# Patient Record
Sex: Female | Born: 1995 | Race: White | Hispanic: No | Marital: Single | Smoking: Never smoker
Health system: Southern US, Community
[De-identification: ages and names within clinical notes are randomized; demographics above are authoritative.]

## PROBLEM LIST (undated history)

## (undated) DIAGNOSIS — F419 Anxiety disorder, unspecified: Secondary | ICD-10-CM

## (undated) DIAGNOSIS — H832X9 Labyrinthine dysfunction, unspecified ear: Secondary | ICD-10-CM

## (undated) DIAGNOSIS — N189 Chronic kidney disease, unspecified: Secondary | ICD-10-CM

## (undated) HISTORY — DX: Anxiety disorder, unspecified: F41.9

## (undated) HISTORY — DX: Chronic kidney disease, unspecified: N18.9

---
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
## 2017-12-19 ENCOUNTER — Encounter (HOSPITAL_COMMUNITY): Payer: Self-pay | Admitting: Emergency Medicine

## 2017-12-19 ENCOUNTER — Ambulatory Visit (HOSPITAL_COMMUNITY)
Admission: EM | Admit: 2017-12-19 | Discharge: 2017-12-19 | Disposition: A | Discharge status: Home / Self Care | Payer: BLUE CROSS/BLUE SHIELD | Attending: Family Medicine | Admitting: Family Medicine

## 2017-12-19 DIAGNOSIS — R531 Weakness: Secondary | ICD-10-CM | POA: Diagnosis present

## 2017-12-19 DIAGNOSIS — R5383 Other fatigue: Secondary | ICD-10-CM

## 2017-12-19 DIAGNOSIS — R7989 Other specified abnormal findings of blood chemistry: Secondary | ICD-10-CM | POA: Diagnosis not present

## 2017-12-19 DIAGNOSIS — K5900 Constipation, unspecified: Secondary | ICD-10-CM | POA: Diagnosis present

## 2017-12-19 LAB — POCT I-STAT, CHEM 8
BUN: 24 mg/dL — AB (ref 6–20)
CALCIUM ION: 1.13 mmol/L — AB (ref 1.15–1.40)
CREATININE: 1.2 mg/dL — AB (ref 0.44–1.00)
Chloride: 102 mmol/L (ref 98–111)
Glucose, Bld: 86 mg/dL (ref 70–99)
HCT: 41 % (ref 36.0–46.0)
Hemoglobin: 13.9 g/dL (ref 12.0–15.0)
POTASSIUM: 3.8 mmol/L (ref 3.5–5.1)
Sodium: 140 mmol/L (ref 135–145)
TCO2: 25 mmol/L (ref 22–32)

## 2017-12-19 LAB — TSH: TSH: 1.191 u[IU]/mL (ref 0.350–4.500)

## 2017-12-19 NOTE — Discharge Instructions (Addendum)
As discussed, your creatinine is elevated today.  Back in January, it was 0.86, today with i-STAT, it was 1.20.  Your BUN was elevated at 24.  this could be an acute change caused by dehydration, but could also be other changes in Jocelyn Williams body that requires further work-up.  Although this lab is concerning, does not require work-up by the emergency department right now.  Please follow-up with your primary care within the week for further evaluation and recheck of creatinine.  Please try to stay hydrated, your urine should be clear to pale yellow in color.  If experiencing worsening symptoms, abdominal pain, nausea, vomiting, weakness, dizziness, passing out, go to the emergency department for further evaluation.

## 2017-12-19 NOTE — ED Provider Notes (Signed)
[facility]    CSN: [id number] Arrival date & time: 12/19/17  1929     History   Chief Complaint Chief Complaint  Patient presents with  . Constipation  . Fatigue    HPI Jocelyn Williams S Kincade is a 22 y.o. female.   22 year old female comes in with mother for constipation, fatigue, leg weakness.  States constipation is normal for her, but did not know if it was related to her other symptoms.  Her main complaint has been 4911-month history of leg weakness, states has been more trained/fatigue to the past few days, and came in for evaluation.  She denies any pain to the lower extremities, states mostly feels as if her legs are about to give out.  She states "I have been playing soccer for many years, and it feels like it played the whole soccer game, although I have not."  No obvious injury/trauma.  No numbness, tingling.  Symptoms are intermittent without obvious aggravating or alleviating factor.  She denies fever, chills, night sweats.  Denies URI symptoms such as cough, congestion, sore throat.  Denies abdominal pain, nausea, vomiting.  Denies urinary symptoms such as frequency, dysuria, hematuria.  States that she has recently started eating more healthy, but denies new supplement use other than fish oil.  She has also started working out at [employer], but she states no extreme or strenuous activity.  Denies heat exposure.  She has continued to eat and drink without difficulty.  Has had regular cycles, though regularly " moderately heavy".  Denies weakness, dizziness, syncope.  Of note, she has also been evaluated by multiple specialist for atypical chest pain.  She has seen cardiology, pulmonology, gastroenterology.  She states has continued to be evaluated for neuralgia, though at the time, did not have similar symptoms to the leg.          History reviewed. No pertinent past medical history.  Patient Active Problem List   Diagnosis Date Noted  . Chest pain 06/15/2015     History reviewed. No pertinent surgical history.  OB History   Jocelyn Williams      Home Medications    Prior to Admission medications   Medication Sig Start Date End Date Taking? Authorizing Provider  Omega-3 Fatty Acids (FISH OIL) 1000 MG CAPS Take by mouth.   Yes [provider]    Family History No family history on file.  Social History Social History   Tobacco Use  . Smoking status: Not on file  Substance Use Topics  . Alcohol use: Not on file  . Drug use: Not on file     Allergies   Patient has no known allergies.   Review of Systems Review of Systems  Reason unable to perform ROS: See HPI as above.     Physical Exam Triage Vital Signs ED Triage Vitals  Enc Vitals Group     BP 12/19/17 1944 98/62     Pulse Rate 12/19/17 1944 67     Resp 12/19/17 1944 16     Temp 12/19/17 1944 98.1 F (36.7 C)     Temp Source 12/19/17 1944 Oral     SpO2 12/19/17 1944 99 %     Weight 12/19/17 1945 135 lb (61.2 kg)     Height --      Head Circumference --      Peak Flow --      Pain Score 12/19/17 1945 0     Pain Loc --  Pain Edu? --      Excl. in GC? --    Orthostatic VS for the past 24 hrs:  BP- Lying Pulse- Lying BP- Sitting Pulse- Sitting BP- Standing at 0 minutes Pulse- Standing at 0 minutes  12/19/17 2044 101/62 56 93/58 65 95/59 78    Updated Vital Signs BP 98/62 (BP Location: Left Arm)   Pulse 67   Temp 98.1 F (36.7 C) (Oral)   Resp 16   Wt 135 lb (61.2 kg)   LMP 11/21/2017   SpO2 99%   Physical Exam  Constitutional: She is oriented to person, place, and time. She appears well-developed and well-nourished. No distress.  HENT:  Head: Normocephalic and atraumatic.  Right Ear: Tympanic membrane, external ear and ear canal normal. Tympanic membrane is not erythematous and not bulging.  Left Ear: Tympanic membrane, external ear and ear canal normal. Tympanic membrane is not erythematous and not bulging.  Nose: Nose normal. Right sinus  exhibits no maxillary sinus tenderness and no frontal sinus tenderness. Left sinus exhibits no maxillary sinus tenderness and no frontal sinus tenderness.  Mouth/Throat: Uvula is midline, oropharynx is clear and moist and mucous membranes are normal.  Eyes: Pupils are equal, round, and reactive to light. Conjunctivae are normal.  Neck: Normal range of motion. Neck supple.  Cardiovascular: Normal rate, regular rhythm and normal heart sounds. Exam reveals no gallop and no friction rub.  No murmur heard. Pulmonary/Chest: Effort normal and breath sounds normal. She has no decreased breath sounds. She has no wheezes. She has no rhonchi. She has no rales.  Abdominal: Soft. Bowel sounds are normal. She exhibits no mass. There is no tenderness. There is no rebound, no guarding and no CVA tenderness.  Musculoskeletal:  No swelling, erythema, increased warmth, contusion seen.  No tenderness to palpation.  Full range of motion of back, hip, knee, ankles.  Strength normal and equal bilaterally.  Sensation intact and equal bilaterally.  Pedal pulse 2+ and equal bilaterally.   Lymphadenopathy:    She has no cervical adenopathy.  Neurological: She is alert and oriented to person, place, and time.  Skin: Skin is warm and dry.  Psychiatric: She has a normal mood and affect. Her behavior is normal. Judgment normal.     UC Treatments / Results  Labs (all labs ordered are listed, but only abnormal results are displayed) Labs Reviewed  POCT I-STAT, CHEM 8 - Abnormal; Notable for the following components:      Result Value   BUN 24 (*)    Creatinine, Ser 1.20 (*)    Calcium, Ion 1.13 (*)    All other components within normal limits  TSH    EKG Jocelyn Williams  Radiology No results found.  Procedures Procedures (including critical care time)  Medications Ordered in UC Medications - No data to display  Initial Impression / Assessment and Plan / UC Course  I have reviewed the triage vital signs and the  nursing notes.  Pertinent labs & imaging results that were available during my care of the patient were reviewed by me and considered in my medical decision making (see chart for details).    Discussed case with Dr. Tracie Harrier as well as Dr. Dayton Scrape.  I-STAT with elevated creatinine of 1.2, with baseline at 0.866 months ago.  Slightly elevated BUN at 24.  Negative orthostatics.  TSH pending.  Discussed although concerning history and exam, no immediate need for further evaluation in the emergency department.  Will have patient push fluids, and  monitor closely.  Patient to follow-up with PCP this week for recheck, further evaluation needed.  Return precautions given.  Patient and mother expresses understanding and agrees to plan.  Final Clinical Impressions(s) / UC Diagnoses   Final diagnoses:  Elevated serum creatinine  Fatigue, unspecified type    ED Prescriptions    Jocelyn Williams        Belinda Fisher, PA-C 12/19/17 2226

## 2017-12-19 NOTE — ED Triage Notes (Signed)
PT reports constipation, fatigue, and leg weakness for weeks.   Last BM was 2 days ago.   Leg weakness is bilateral.

## 2017-12-27 ENCOUNTER — Encounter: Payer: Self-pay | Admitting: Dietician

## 2017-12-27 ENCOUNTER — Encounter: Payer: BLUE CROSS/BLUE SHIELD | Attending: Pediatrics | Admitting: Dietician

## 2017-12-27 VITALS — Ht 66.0 in | Wt 120.3 lb

## 2017-12-27 DIAGNOSIS — R634 Abnormal weight loss: Secondary | ICD-10-CM | POA: Diagnosis not present

## 2017-12-27 DIAGNOSIS — R5383 Other fatigue: Secondary | ICD-10-CM

## 2017-12-27 NOTE — Patient Instructions (Signed)
   Start eating a light snack at suppertime such as rice cake and peanut butter, cheese or peanut butter and crackers, oatmeal with nuts and fruit, sandwich, etc. Gradually increase the amount of food eaten.   Continue with Ensure in evenings until able to eat a full supper meal or weight starts to increase steadily.   Add 1 or 2 servings of carb foods at lunch time.   Can add nuts, peanut butter, or cheese with fruit for snacks.   Set reminder alarms to eat regularly. Allow for small meals and several snacks daily to avoid feeling too full.   Try a mild fiber supplement such as guar gum to help with bowel movements. Discuss with physician.

## 2017-12-27 NOTE — Progress Notes (Signed)
Medical Nutrition Therapy: Visit start time: 1100  end time: 1200  Assessment:  Diagnosis: abnormal weight loss, fatigue Past medical history: history of chest pain Psychosocial issues/ stress concerns: none currently  Preferred learning method:  Jill Alexanders. Visual . Hands-on   Current weight: 120.3lbs Height: 5'6" Medications, supplements: fish oil and multivitamin supplements, no other meds  Progress and evaluation: patient reports weight of 160lbs 2-3 years ago when playing soccer and training to play at college level. She then had an injury which kept her from playing soccer and lost her muscle tone but remained at same weight -- she then began dieting to decrease body fat, following low carb eating pattern. She now is experiencing low energy and feeling full easily when eating; feels bloated and very uncomfortable when eating a meal at suppertime. Wants to be able to exercise more and rebuild muscle to resume soccer.  Physical activity: little in past several weeks; was doing cardio and weight training for 60 minutes, 4 times a week.  Dietary Intake:  Usual eating pattern includes 2 meals and 2-3 snacks per day. Dining out frequency: 2 meals per week.  Breakfast: toast with avocado or guacamole, 2 eggs, maybe nuts, water Snack: none or fruit Lunch: microwave vegetables + meat ie hot dog or burger with mayo and mustard, no bread  Snack: fruit Supper: none --not hungry, unable to eat a full meal. Snacks on fruit and/or Ensure Snack: Ensure Beverages: about 63oz water, Ensure  Nutrition Care Education: Topics covered: weight management, basic nutrition Basic nutrition: basic food groups, appropriate nutrient balance, appropriate meal and snack schedule, general nutrition guidelines    Weight control: identifying healthy weight, strategies for increasing caloric intake including small, frequent meals/ snacks, avoiding fluids close to and during meals, adding healthy fats to meals, scheduling  meal and snack times and reminders. Calculated caloric needs for weight gain at 2000kcal or more and provided guidance for at least 40% CHO. Discussed gradually increasing the amount of carbohydrate with meals and healthy carb choices.  Other: discussed options for fiber/ stool softening supplements including guar gum fiber; advised patient to discuss further with MD.   Nutritional Diagnosis:  [place]-3.2 Unintentional weight loss As related to muscle loss and dieting.  As evidenced by patient report of efforts to lose body fat, following very low carbohydrate diet, with current BMI of 19.  Intervention: Instruction as noted above.   Set goals with direction from patient.    Patient will plan to schedule follow-up later as needed; none scheduled at this time.  Education Materials given:  . Weight Gain in a Healthy Way . Food lists/ Planning A Balanced Meal . Goals/ instructions   Learner/ who was taught:  . Patient    Level of understanding: Marland Kitchen. Verbalizes/ demonstrates competency  Demonstrated degree of understanding via:   Teach back Learning barriers: . None  Willingness to learn/ readiness for change: . Eager, change in progress   Monitoring and Evaluation:  Dietary intake, exercise, and body weight      follow up: prn

## 2018-02-20 ENCOUNTER — Emergency Department (HOSPITAL_COMMUNITY)
Admission: EM | Admit: 2018-02-20 | Discharge: 2018-02-20 | Disposition: A | Discharge status: Home / Self Care | Payer: BLUE CROSS/BLUE SHIELD | Attending: Emergency Medicine | Admitting: Emergency Medicine

## 2018-02-20 ENCOUNTER — Encounter (HOSPITAL_COMMUNITY): Payer: Self-pay | Admitting: Emergency Medicine

## 2018-02-20 ENCOUNTER — Other Ambulatory Visit: Payer: Self-pay

## 2018-02-20 DIAGNOSIS — F4320 Adjustment disorder, unspecified: Secondary | ICD-10-CM | POA: Diagnosis not present

## 2018-02-20 DIAGNOSIS — Z79899 Other long term (current) drug therapy: Secondary | ICD-10-CM | POA: Insufficient documentation

## 2018-02-20 DIAGNOSIS — R45851 Suicidal ideations: Secondary | ICD-10-CM | POA: Insufficient documentation

## 2018-02-20 DIAGNOSIS — F329 Major depressive disorder, single episode, unspecified: Secondary | ICD-10-CM | POA: Diagnosis present

## 2018-02-20 HISTORY — DX: Labyrinthine dysfunction, unspecified ear: H83.2X9

## 2018-02-20 LAB — ACETAMINOPHEN LEVEL: Acetaminophen (Tylenol), Serum: <10 ug/mL — ABNORMAL LOW (ref 10–30)

## 2018-02-20 LAB — RAPID URINE DRUG SCREEN, HOSP PERFORMED
AMPHETAMINES: NOT DETECTED
BARBITURATES: NOT DETECTED
Benzodiazepines: NOT DETECTED
Cocaine: NOT DETECTED
Opiates: NOT DETECTED
TETRAHYDROCANNABINOL: POSITIVE — AB

## 2018-02-20 LAB — COMPREHENSIVE METABOLIC PANEL
ALT: 34 U/L (ref 0–44)
AST: 25 U/L (ref 15–41)
Albumin: 4.5 g/dL (ref 3.5–5.0)
Alkaline Phosphatase: 50 U/L (ref 38–126)
Anion gap: 10 (ref 5–15)
BUN: 10 mg/dL (ref 6–20)
CHLORIDE: 105 mmol/L (ref 98–111)
CO2: 26 mmol/L (ref 22–32)
CREATININE: 0.85 mg/dL (ref 0.44–1.00)
Calcium: 10.5 mg/dL — ABNORMAL HIGH (ref 8.9–10.3)
GFR calc Af Amer: >60 mL/min (ref >60)
GLUCOSE: 86 mg/dL (ref 70–99)
Potassium: 4.3 mmol/L (ref 3.5–5.1)
Sodium: 141 mmol/L (ref 135–145)
Total Bilirubin: 1 mg/dL (ref 0.3–1.2)
Total Protein: 7.7 g/dL (ref 6.5–8.1)

## 2018-02-20 LAB — CBC
HEMATOCRIT: 43.1 % (ref 36.0–46.0)
Hemoglobin: 14.2 g/dL (ref 12.0–15.0)
MCH: 31.8 pg (ref 26.0–34.0)
MCHC: 32.9 g/dL (ref 30.0–36.0)
MCV: 96.4 fL (ref 78.0–100.0)
Platelets: 285 10*3/uL (ref 150–400)
RBC: 4.47 MIL/uL (ref 3.87–5.11)
RDW: 12.2 % (ref 11.5–15.5)
WBC: 5.1 10*3/uL (ref 4.0–10.5)

## 2018-02-20 LAB — SALICYLATE LEVEL: Salicylate Lvl: <7 mg/dL (ref 2.8–30.0)

## 2018-02-20 LAB — I-STAT BETA HCG BLOOD, ED (MC, WL, AP ONLY): I-stat hCG, quantitative: <5 m[IU]/mL (ref <5)

## 2018-02-20 LAB — ETHANOL

## 2018-02-20 NOTE — ED Triage Notes (Signed)
Pt presents to ED for assessment due to sensory overload with her current conditions.  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  States at the time she tried a low dose of Zoloft which worked for her, but is not currently taking anything.  States she was not open to treatment when she was younger, but would like to try something now.  Denies SI.  Pt states she has anger issues that come out when she is stressed, and "I feel like my brain is fighting with itself" over not acting on those aggressive impulses.

## 2018-02-20 NOTE — Discharge Instructions (Addendum)
Please read attached information. If you experience any new or worsening signs or symptoms please return to the emergency room for evaluation. Please follow-up with your primary care provider or specialist as discussed.  °

## 2018-02-20 NOTE — ED Notes (Signed)
Spoke with Trey PaulaJeff, [place] regarding pt appropriateness for POD and required standard of work.  Trey PaulaJeff, PA states pt is appropriate for hall and can remain there, PA waiting for labs to come back.

## 2018-02-20 NOTE — ED Notes (Signed)
Pt and mother state pt needs to leave d/t she has school assignments due by midnight and feels she cannot wait any longer. Pt denies SI/HI. Mamie LaurelJ Hedges, PA, aware. Pt and mother voiced understanding for pt to return if new or worsening symptoms. Referrals/resources given.

## 2018-02-20 NOTE — ED Provider Notes (Signed)
[facility] EMERGENCY DEPARTMENT Provider Note   CSN: [id number] Arrival date & time: 02/20/18  1356     History   Chief Complaint Chief Complaint  Patient presents with  . Depression  . Psychiatric Evaluation    HPI Jocelyn Williams is a 22 y.o. female.  HPI   22 year old female presents today with complaints of depression.  Patient notes a past medical history of depression.  Patient notes that she feels like she gets into her head too much.  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  She notes she was going to enrolling [employer] but missed the cut off so now she is taking online courses and not interacting on a daily basis with friends.  She notes she recently moved into her own apartment which causes loneliness and depression.  She notes she has had fleeting thoughts of suicide but has no specific plan vision, or intent.  No history of suicide attempts in the past.  Patient notes that she is in the process of making contact with psychiatry and psychologist presently.  Past Medical History:  Diagnosis Date  . Vestibular disequilibrium     Patient Active Problem List   Diagnosis Date Noted  . Chest pain 06/15/2015    History reviewed. No pertinent surgical history.   OB History   None      Home Medications    Prior to Admission medications   Medication Sig Start Date End Date Taking? Authorizing Provider  Multiple Vitamins-Minerals (MULTIVITAMIN WITH MINERALS) tablet Take 1 tablet by mouth daily.   Yes [provider]  Omega-3 Fatty Acids (FISH OIL) 1000 MG CAPS Take 1,000-2,000 mg by mouth See admin instructions. Take 2 capsules in the morning, and 1 capsule in the evening   Yes [provider]    Family History History reviewed. No pertinent family history.  Social History Social History   Tobacco Use  . Smoking status: Never Smoker  .  Smokeless tobacco: Never Used  Substance Use Topics  . Alcohol use: Never    Frequency: Never  . Drug use: Not on file     Allergies   Lactose intolerance (gi)   Review of Systems Review of Systems  All other systems reviewed and are negative.    Physical Exam Updated Vital Signs BP 113/69 (BP Location: Left Arm)   Pulse (!) 57   Temp 98.3 F (36.8 C) (Oral)   Resp 18   LMP 02/20/2018   SpO2 100%   Physical Exam  Constitutional: She is oriented to person, place, and time. She appears well-developed and well-nourished.  HENT:  Head: Normocephalic and atraumatic.  Eyes: Pupils are equal, round, and reactive to light. Conjunctivae are normal. Right eye exhibits no discharge. Left eye exhibits no discharge. No scleral icterus.  Neck: Normal range of motion. No JVD present. No tracheal deviation present.  Pulmonary/Chest: Effort normal. No stridor.  Neurological: She is alert and oriented to person, place, and time. Coordination normal.  Psychiatric: She has a normal mood and affect. Her behavior is normal. Judgment and thought content normal.  Nursing note and vitals reviewed.    ED Treatments / Results  Labs (all labs ordered are listed, but only abnormal results are displayed) Labs Reviewed  COMPREHENSIVE METABOLIC PANEL - Abnormal; Notable for the following components:      Result Value   Calcium 10.5 (*)  All other components within normal limits  ACETAMINOPHEN LEVEL - Abnormal; Notable for the following components:   Acetaminophen (Tylenol), Serum <10 (*)    All other components within normal limits  RAPID URINE DRUG SCREEN, HOSP PERFORMED - Abnormal; Notable for the following components:   Tetrahydrocannabinol POSITIVE (*)    All other components within normal limits  ETHANOL  SALICYLATE LEVEL  CBC  I-STAT BETA HCG BLOOD, ED ([facility], [facility], [facility] ONLY)    EKG None  Radiology No results found.  Procedures Procedures (including critical care  time)  Medications Ordered in ED Medications - No data to display   Initial Impression / Assessment and Plan / ED Course  I have reviewed the triage vital signs and the nursing notes.  Pertinent labs & imaging results that were available during my care of the patient were reviewed by me and considered in my medical decision making (see chart for details).     22 year old female presents today with likely adjustment disorder.  Patient having difficulty in new situations.  She has no suicidal plan or intent.  She does have outpatient resources that she is attempting to follow-up with presently.  Patient does not appear to be significantly distressed at the time of my evaluation.  Prior to being evaluated by TTS patient and mother decided they would like to go home as she has schoolwork to do in by not getting schoolwork done with worsen her symptoms.  I do find this is reasonable at this time.  Patient discharged with strict return precautions outpatient follow-up information.  Both the patient and mother verbalized understanding and agreement to today's plan had no further questions or concerns.  Final Clinical Impressions(s) / ED Diagnoses   Final diagnoses:  Adjustment disorder, unspecified type    ED Discharge Orders    None       Rosalio LoudHedges, Santo Zahradnik, PA-C 02/20/18 1915    Mesner, Barbara CowerJason, MD 02/20/18 2126

## 2018-02-21 ENCOUNTER — Ambulatory Visit (HOSPITAL_COMMUNITY)
Admission: RE | Admit: 2018-02-21 | Discharge: 2018-02-21 | Disposition: A | Discharge status: Home / Self Care | Payer: BLUE CROSS/BLUE SHIELD | Attending: Psychiatry | Admitting: Psychiatry

## 2018-02-21 DIAGNOSIS — F411 Generalized anxiety disorder: Secondary | ICD-10-CM | POA: Insufficient documentation

## 2018-02-21 DIAGNOSIS — Z1330 Encounter for screening examination for mental health and behavioral disorders, unspecified: Secondary | ICD-10-CM | POA: Insufficient documentation

## 2018-02-21 NOTE — H&P (Signed)
Behavioral Health Medical Screening Exam  Jocelyn Williams is an 22 y.o. female presented for symptoms of anxiety and depression. Patient wants to see an outpatient provider. Patient denies any suicidal ideation, homicidal ideation, any paranoia and psychosis. For details , please see TTS assessment  Total Time spent with patient: 30 minutes  Psychiatric Specialty Exam: Physical Exam  Review of Systems  Constitutional: Negative.  Negative for chills, fever and malaise/fatigue.  HENT: Negative.  Negative for congestion and hearing loss.   Eyes: Negative.  Negative for blurred vision and double vision.  Respiratory: Negative.  Negative for cough, shortness of breath and wheezing.   Cardiovascular: Negative for chest pain and palpitations.  Gastrointestinal: Negative for abdominal pain, heartburn, nausea and vomiting.  Musculoskeletal: Negative.  Negative for falls and myalgias.  Skin: Negative.  Negative for rash.  Neurological: Negative.  Negative for dizziness, seizures, loss of consciousness and weakness.  Endo/Heme/Allergies: Negative.  Negative for environmental allergies.  Psychiatric/Behavioral: Positive for depression. Negative for hallucinations, memory loss, substance abuse and suicidal ideas. The patient is nervous/anxious. The patient does not have insomnia.     Last menstrual period 02/20/2018.There is no height or weight on file to calculate BMI.  General Appearance: Casual  Eye Contact:  Good  Speech:  Clear and Coherent and Normal Rate  Volume:  Normal  Mood:  Anxious and Dysphoric  Affect:  Appropriate, Congruent and Full Range  Thought Process:  Coherent, Goal Directed and Descriptions of Associations: Intact  Orientation:  Full (Time, Place, and Person)  Thought Content:  Rumination  Suicidal Thoughts:  No  Homicidal Thoughts:  No  Memory:  Immediate;   Fair Recent;   Fair Remote;   Fair  Judgement:  Intact  Insight:  Shallow  Psychomotor Activity:  Normal   Concentration: Concentration: Fair and Attention Span: Fair  Recall:  [employer]Fair  Fund of Knowledge:Fair  Language: Fair  Akathisia:  No  Handed:  Right  AIMS (if indicated):     Assets:  Communication Skills Desire for Improvement Financial Resources/Insurance Housing Physical Health Social Support Talents/Skills Transportation  Sleep:       Musculoskeletal: Strength & Muscle Tone: within normal limits Gait & Station: normal Patient leans: N/A  Last menstrual period 02/20/2018.  Recommendations: Patient has an appointment with Dr Jerold CoombeUmrania on 02/23/18 at 11:00 A.M at [facility] Based on my evaluation the patient does not appear to have an emergency medical condition.  Nelly RoutArchana Neveah Bang, MD 02/21/2018, 11:25 AM

## 2018-02-21 NOTE — BH Assessment (Signed)
Assessment Note  Jocelyn Williams is an 22 y.o. female. Pt denies SI/HI and AVH. Pt denies previous SI attempts. Pt reports anxiety and depression. Per Pt she had therapy 2 1/2 years ago and she is currently in need of outpatient treatment at this time. Pt states she has been dealing with injuries related to sports and school issues which have escacerbated her depression and anxiety. Pt denies previous hospitalizations. Pt is not currently prescribed mental health medication. Pt denies SA.  Dr. Lucianne MussKumar recommends D/C and outpatient resources. Appointment made at [facility] [place] for the [place]. Pt has a scheduled appointment on 02/23/18 at 11am.   Diagnosis:  F32.9 Unspecified depression; F41.1 GAD  Past Medical History:  Past Medical History:  Diagnosis Date  . Vestibular disequilibrium     No past surgical history on file.  Family History: No family history on file.  Social History:  reports that she has never smoked. She has never used smokeless tobacco. She reports that she does not drink alcohol. Her drug history is not on file.  Additional Social History:  Alcohol / Drug Use Pain Medications: please see mar Prescriptions: please see mar Over the Counter: please see mar History of alcohol / drug use?: No history of alcohol / drug abuse Longest period of sobriety (when/how long): NA  CIWA:   COWS:    Allergies:  Allergies  Allergen Reactions  . Lactose Intolerance (Gi) Diarrhea and Nausea Only    Stomach pains    Home Medications:  (Not in a hospital admission)  OB/GYN Status:  Patient's last menstrual period was 02/20/2018.  General Assessment Data [facility] TTS Assessment: In system Is this a Tele or Face-to-Face Assessment?: Face-to-Face Is this an Initial Assessment or a Re-assessment for this encounter?: Initial Assessment Patient Accompanied by:: Parent Language Other than English:  No Living Arrangements: Other (Comment)(home) What gender do you identify as?: Female Marital status: Single Maiden name: NA Pregnancy Status: No Living Arrangements: Alone Can pt return to current living arrangement?: Yes Admission Status: Voluntary Is patient capable of signing voluntary admission?: Yes Referral Source: Self/Family/Friend Insurance type: BCBS  Medical Screening Exam [facility] Walk-in ONLY) Medical Exam completed: Yes  Crisis Care Plan Living Arrangements: Alone Legal Guardian: Other:(self) Name of Psychiatrist: NA Name of Therapist: NA  Education Status Is patient currently in school?: Yes Current Grade: online school Highest grade of school patient has completed: 12 Name of school: NA Contact person: NA IEP information if applicable: NA  Risk to self with the past 6 months Suicidal Ideation: No Has patient been a risk to self within the past 6 months prior to admission? : No Suicidal Intent: No Has patient had any suicidal intent within the past 6 months prior to admission? : No Is patient at risk for suicide?: No Suicidal Plan?: No Has patient had any suicidal plan within the past 6 months prior to admission? : No Access to Means: No What has been your use of drugs/alcohol within the last 12 months?: NA Previous Attempts/Gestures: No How many times?: 0 Other Self Harm Risks: NA Triggers for Past Attempts: None known Intentional Self Injurious Behavior: None Family Suicide History: No Recent stressful life event(s): Other (Comment)(school issues) Persecutory voices/beliefs?: No Depression: Yes Depression Symptoms: Isolating, Loss of interest in usual pleasures, Feeling angry/irritable Substance abuse history and/or treatment for substance abuse?: No Suicide prevention information given to non-admitted patients: Not applicable  Risk to Others within the past  6 months Homicidal Ideation: No Does patient have any lifetime risk of violence toward others  beyond the six months prior to admission? : No Thoughts of Harm to Others: No Current Homicidal Intent: No Current Homicidal Plan: No Access to Homicidal Means: No Identified Victim: NA History of harm to others?: No Assessment of Violence: None Noted Violent Behavior Description: NA Does patient have access to weapons?: No Criminal Charges Pending?: No Does patient have a court date: No Is patient on probation?: No  Psychosis Hallucinations: None noted Delusions: None noted  Mental Status Report Appearance/Hygiene: Bizarre, Unremarkable Eye Contact: Fair Motor Activity: Freedom of movement Speech: Logical/coherent Level of Consciousness: Alert Mood: Anxious Affect: Anxious Anxiety Level: Minimal Thought Processes: Relevant, Coherent Judgement: Unimpaired Orientation: Person, Place, Time, Situation Obsessive Compulsive Thoughts/Behaviors: None  Cognitive Functioning Concentration: Normal Memory: Remote Intact, Recent Intact Is patient IDD: No Insight: Fair Impulse Control: Fair Have you had any weight changes? : No Change Sleep: No Change Total Hours of Sleep: 8 Vegetative Symptoms: None  ADLScreening [facility] Assessment Services) Patient's cognitive ability adequate to safely complete daily activities?: Yes Patient able to express need for assistance with ADLs?: Yes Independently performs ADLs?: Yes (appropriate for developmental age)  Prior Inpatient Therapy Prior Inpatient Therapy: No  Prior Outpatient Therapy Prior Outpatient Therapy: No Does patient have an ACCT team?: No Does patient have Intensive In-House Services?  : No Does patient have [facility] services? : No  ADL Screening (condition at time of admission) Patient's cognitive ability adequate to safely complete daily activities?: Yes Is the patient deaf or have difficulty hearing?: No Does the patient have difficulty seeing, even when wearing glasses/contacts?: No Does the patient have difficulty  concentrating, remembering, or making decisions?: No Patient able to express need for assistance with ADLs?: Yes Does the patient have difficulty dressing or bathing?: No Independently performs ADLs?: Yes (appropriate for developmental age)       Abuse/Neglect Assessment (Assessment to be complete while patient is alone) Abuse/Neglect Assessment Can Be Completed: Yes Physical Abuse: Denies Verbal Abuse: Denies Sexual Abuse: Denies Exploitation of patient/patient's resources: Denies     Merchant navy officer (For Healthcare) Does Patient Have a Medical Advance Directive?: No Would patient like information on creating a medical advance directive?: No - Patient declined          Disposition:  Disposition Initial Assessment Completed for this Encounter: Yes Disposition of Patient: Discharge Patient refused recommended treatment: No Mode of transportation if patient is discharged?: Car  On Site Evaluation by:   Reviewed with Physician:    Wolfgang [place] D 02/21/2018 12:12 PM

## 2018-02-23 ENCOUNTER — Other Ambulatory Visit: Payer: Self-pay

## 2018-02-23 ENCOUNTER — Encounter: Payer: Self-pay | Admitting: Child and Adolescent Psychiatry

## 2018-02-23 ENCOUNTER — Ambulatory Visit: Payer: BLUE CROSS/BLUE SHIELD | Admitting: Child and Adolescent Psychiatry

## 2018-02-23 VITALS — BP 107/72 | HR 69 | Temp 98.0°F | Wt 155.6 lb

## 2018-02-23 DIAGNOSIS — F418 Other specified anxiety disorders: Secondary | ICD-10-CM | POA: Diagnosis not present

## 2018-02-23 DIAGNOSIS — F329 Major depressive disorder, single episode, unspecified: Secondary | ICD-10-CM

## 2018-02-23 DIAGNOSIS — F32A Depression, unspecified: Secondary | ICD-10-CM

## 2018-02-23 MED ORDER — ESCITALOPRAM OXALATE 5 MG PO TABS: 5.0000 mg | ORAL_TABLET | Freq: Every day | ORAL | 0 refills | Status: DC

## 2018-02-23 NOTE — Progress Notes (Signed)
Psychiatric Initial Adult Assessment   Patient Identification: Jocelyn Williams MRN:  [id number] Date of Evaluation:  02/23/2018 Referral Source: Dr. Lucianne Muss at [facility]  Chief Complaint:   Chief Complaint    Establish Care; Anxiety; Depression; Stress     Visit Diagnosis:    ICD-10-CM   1. Other specified anxiety disorders F41.8 escitalopram (LEXAPRO) 5 MG tablet  2. Depressive disorder F32.9 escitalopram (LEXAPRO) 5 MG tablet    History of Present Illness: This is a 22 year old Caucasian female with no significant medical history and psychiatric history significant of anxiety referred by Dr. Lucianne Muss at [facility] for psychiatric evaluation after pt presented to [facility] walk in to establish outpatient psychiatry follow-up.  Jocelyn Williams was seen and evaluated alone and together her father is per her request.  She states "I am having little bit trouble with depression" which she describes as feeling down, poor motivation, anhedonia, feeling panicky and anxiety since the last few weeks in the context of recent psychosocial stressors which mainly includes not being able to living alone in [place] and feeling more lonely, not being able to get into [employer] as planned which she reported was due to a mistake by teacher. She reported that she went to [facility] as she was feeling overwhelmed and was referred to this clinic. She denied difficulties with sleep or appetite. She denied poor energy or concentration difficulties. In regards of anxiety, she reported that she stress would build up and then she would have a panic attack.   She reports hx of anxiety previously around 2016 and was taking Zoloft and seeing therapist at that time however has not been any psychiatric treatment since then. She reported that she previously had Obsessive compulsive rituals such as waking equally on grass and concrete, or keeping odd number channel on the TV, however denies currently. She denies any AVH, did not admit to delusions, denied any manic or  hypomanic symptoms, denies any hx of eating disorder.   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 She reported that following this she could not play soccer as everytime she would play, she would start having chest pain and difficulties breathing. She and father reported that she had extensive medical work up which was negative for any physical issues explaining her symptoms.   She reported that prior to the incident during her freshman and sophomore years in HS she was bullied by her coach and upper classmen which was significantly stressful to her.   She reported that after the incident she was not able to go to gym or play soccer as usual and that was her major stressor. She reported that she decided to go to [employer] in [place], however had difficult time there so she changed to [employer] with plan to attend [employer] this year. She reported that teacher made a mistake on putting her grades and therefore she was late to attend the fall semester at [employer] and now waiting to join in Spring Semester.    Associated Signs/Symptoms: Depression Symptoms:  depressed mood, anhedonia, anxiety, panic attacks, low motivation (Hypo) Manic Symptoms:  None Anxiety Symptoms:  Panic Symptoms, Psychotic Symptoms:  None PTSD Symptoms: Negative  Past Psychiatric History: Denies hx of previous psychiatric hospitalizations.  Reported history of trauma having Zoloft in high school however stopped because  of adverse effects.  Reports history of seeing therapist for a brief period of time in high school stopped and she did not find it helpful.  [facility] for the concerns for anxiety and establish outpatient psychiatry follow-up.  Previous Psychotropic Medications: Yes   Substance Abuse History in the  last 12 months:  No.  Consequences of Substance Abuse: Negative  Past Medical History:  Past Medical History:  Diagnosis Date  . Anxiety   . Chronic kidney disease   . Vestibular disequilibrium    History reviewed. No pertinent surgical history.  Family Psychiatric History: Mother with undiagnosed anxiety  Family History: History reviewed. No pertinent family history.  Social History:   Social History   Socioeconomic History  . Marital status: Single    Spouse name: Not on file  . Number of children: 0  . Years of education: Not on file  . Highest education level: Some college, no degree  Occupational History  . Not on file  Social Needs  . Financial resource strain: Not hard at all  . Food insecurity:    Worry: Never true    Inability: Never true  . Transportation needs:    Medical: No    Non-medical: No  Tobacco Use  . Smoking status: Never Smoker  . Smokeless tobacco: Never Used  Substance and Sexual Activity  . Alcohol use: Never    Frequency: Never  . Drug use: Never  . Sexual activity: Not Currently  Lifestyle  . Physical activity:    Days per week: 7 days    Minutes per session: 60 min  . Stress: Not at all  Relationships  . Social connections:    Talks on phone: More than three times a week    Gets together: More than three times a week    Attends religious service: More than 4 times per year    Active member of club or organization: No    Attends meetings of clubs or organizations: Never    Relationship status: Never married  Other Topics Concern  . Not on file  Social History Narrative  . Not on file    Additional Social History: Patient has been living by herself since the last 2 months in [place].  She reports that she is taking online classes will be a [employer].  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   Allergies:   Allergies  Allergen Reactions  . Lactose Intolerance (Gi) Diarrhea  and Nausea Only    Stomach pains    Metabolic Disorder Labs: No results found for: HGBA1C, MPG No results found for: PROLACTIN No results found for: CHOL, TRIG, HDL, CHOLHDL, VLDL, LDLCALC   Current Medications: Current Outpatient Medications  Medication Sig Dispense Refill  . Multiple Vitamins-Minerals (MULTIVITAMIN WITH MINERALS) tablet Take 1 tablet by mouth daily.    . Omega-3 Fatty Acids (FISH OIL) 1000 MG CAPS Take 1,000-2,000 mg by mouth See admin instructions. Take 2 capsules in the morning, and 1 capsule in the evening    . escitalopram (LEXAPRO) 5 MG tablet Take 1 tablet (5 mg total) by mouth daily. 30 tablet 0   No current facility-administered medications for this visit.     Neurologic: Headache: Negative Seizure: Negative Paresthesias:NA  Musculoskeletal:  Gait & Station: normal Patient leans: N/A  Psychiatric Specialty Exam: Review of Systems  Constitutional: Negative for fever.  Neurological: Negative for seizures.  Psychiatric/Behavioral: Positive  for depression. Negative for hallucinations, memory loss, substance abuse and suicidal ideas. The patient is nervous/anxious. The patient does not have insomnia.     Blood pressure 107/72, pulse 69, temperature 98 F (36.7 C), temperature source Oral, weight 155 lb 9.6 oz (70.6 kg), last menstrual period 02/20/2018.Body mass index is 25.11 kg/m.  General Appearance: Casual and Well Groomed  Eye Contact:  Good  Speech:  Clear and Coherent and Normal Rate  Volume:  Normal  Mood:  "good"  Affect:  Appropriate, Congruent and Full Range  Thought Process:  Goal Directed and Linear  Orientation:  Full (Time, Place, and Person)  Thought Content:  Logical  Suicidal Thoughts:  No  Homicidal Thoughts:  No  Memory:  Immediate;   Fair Recent;   Fair Remote;   Fair  Judgement:  Intact  Insight:  Good  Psychomotor Activity:  Normal  Concentration:  Concentration: Good and Attention Span: Good  Recall:  Good  Fund  of Knowledge:Good  Language: Good  Akathisia:  No    AIMS (if indicated):  Not done  Assets:  Communication Skills Desire for Improvement Financial Resources/Insurance Housing Physical Health Social Support Transportation Vocational/Educational  ADL's:  Intact  Cognition: WNL  Sleep:  Good   Synopsis: This is a 22 year old Caucasian female with no significant medical history and psychiatric history significant of anxiety referred by Dr. Lucianne Muss at [facility] for psychiatric evaluation after pt presented to [facility] walk in to establish outpatient psychiatry follow-up.  Assessment: Pt with genetic predisposition to anxiety and personal hx of anxiety treated briefly in HS. Pt's current presentation is most likely consistent of anxiety and depressive disorder in the context of acute over chronic psychosocial stressors mentioned above. Pt will benefit from medication management and ind therapy. No safety concerns expressed by pt or parent.   Treatment Plan Summary: Discussed indications supporting diagnoses of anxiety and depressive disorder.  Recommend starting Lexapro 5 mg once a day for depression/anxiety.  Discussed potential benefit, side effects, black box warning of suicidal ideations, directions for administration, contact with questions/concerns. Discussed importance of individual therapy and recommended schedule an appointment in the clinic for therapy. 60 mins with patient with greater than 50% counseling as above.   Darcel Smalling, MD 9/20/20191:13 PM

## 2018-02-23 NOTE — Progress Notes (Signed)
Jocelyn Williams is a 22 y.o. female in treatment for anxiety and depression and displays the following risk factors for Suicide:  Demographic factors:  Adolescent or young adult, Caucasian and Living alone Current Mental Status: No plan to harm self or others Loss Factors: None Historical Factors: Family history of mental illness or substance abuse Risk Reduction Factors: Employed, Positive social support and Positive coping skills or problem solving skills  CLINICAL FACTORS:  Panic Attacks  COGNITIVE FEATURES THAT CONTRIBUTE TO RISK: None    SUICIDE RISK:  Minimal: No identifiable suicidal ideation.  Patients presenting with no risk factors but with morbid ruminations; may be classified as minimal risk based on the severity of the depressive symptoms  Mental Status: As mentioned in H&P from today's visit.   PLAN OF CARE: Jocelyn Williams currently denies any SI/HI and does not appear in imminent danger to self/others. Her hx of depression, anxiety, puts her at a chronically elevated risk of self harm. She is future oriented, appears intelligent, has long term goals for herself, does have good support from parents, and appears to have financial stability and these all will likely serve as protective factors for her. She is recommended to follow up with this clinic for medications, and ind therapy which would likely help reduce chronic risk.     Darcel SmallingHiren M Skarlet Lyons, MD 02/23/2018, 1:09 PM

## 2018-03-16 ENCOUNTER — Ambulatory Visit: Payer: BLUE CROSS/BLUE SHIELD | Admitting: Child and Adolescent Psychiatry

## 2018-03-16 ENCOUNTER — Telehealth: Payer: Self-pay | Admitting: Child and Adolescent Psychiatry

## 2018-03-16 ENCOUNTER — Encounter: Payer: Self-pay | Admitting: Child and Adolescent Psychiatry

## 2018-03-16 NOTE — Progress Notes (Signed)
Pt was scheduled to follow up this morning with Probation officer. Instead of pt showing up for appointment, pt's parents came to get advice to help patient. Writer called pt to get consent to disclose any clinical information, however pt did not pick up the phone. Pt during the initial evaluation was seen with father per her request and her consent. Parents were informed that writer will be restricted in providing details of the treatment. Parents reported that pt is not coming for appointment today and they have called her multiple times since morning and she has not picked up the phone. They reported that pt does have tendency to not answer phones, and may have been sleeping which has happened in the past. Parents report that week after the last appointment, pt's boyfriend whom she has been seeing since about 1 year on and off decided to break up with her. Her father reported that pt threatened to jump from the balcony, and was stopped by her boyfriend. Father reported that pt told him (father) that she threatened to get boyfriend back and did not have intention to hurt self. He reported that pt had panic attack however he was able to talk her through and she was able to eventually calm herself down and then they noted her returning to her usual baseline over the next week. They reported that over the last weekend pt's cousin had a wedding and they were able to initially convince her to go to wedding but she eventually refused because she was probably afraid about answering questions to other family on what she is doing, feeling ashamed for not going to college, etc. They reported that after wedding reception on the last weekend they went to meet her and check on her and she met them outside of her apartment not wanting them to come to her apartment. They expressed concerns on patient not doing well overall. Discussed with parents different treatment options available that include inpatient hospitalization, PHP and IOP at  [facility] outpatient office. Discussed to go to pt's apartment and check on her after they leave the office. Discuss to bring her to this office to see this writer during the normal business hours or alternative go to ER for assessment for any safety concerns. Discussed that pt may get benefit from voluntary admission in the absence of immediate safety concerns or more intensive outpatient treatment such as IOP or PHP. Parents left the office with plan to check on her at her apartment and decide the next step.   Writer later followed up with mother, who reported that pt returned her call and around the same time father reached her home. She reported that pt did not open the door when father reached her however talked to her on the phone. Mother reported that most likely her boyfriend was in the apartment and likely did not open the door because of that. She reported that pt told her that treatment is not going to benefit her, "she will be like this her entire life." and she should quit trying getting treatment and refused to follow up with the clinic or explore other treatment option. Mother denied any safety concerns for the patient. Discussed with mother to call 911 or bring pt to ER for any safety concerns.  Writer subsequently called pt to follow up. Pt did not answer and writer left VM encouraging her to make follow up appointment with the clinic and also discuss treatment options. Also informed to call 911 or go to ER for  any safety concerns.   Writer called mother in presence of Ms. Norton (CMA) at the clinic. Discussed that Probation officer has Ms. Lucas Mallow join the call so she can follow up with her on Monday and direct her to covering providers for this Probation officer for any concerns or questions since Probation officer will not be in the clinic next week. Informed mother that writer has left VM for pt stating above. Also provided numbers for the [facility] outpatient clinic in case they decide PHP or IOP. Recommended mother to  have frequent check in with patient. Mother denied any immediate safety concerns and agreed to call 911 or bring pt to ER for any immediate safety concerns.

## 2018-03-26 NOTE — Telephone Encounter (Signed)
Created in error, please see the other encounter from today.

## 2018-04-05 ENCOUNTER — Ambulatory Visit: Payer: Self-pay | Admitting: Child and Adolescent Psychiatry

## 2018-04-05 ENCOUNTER — Telehealth: Payer: Self-pay | Admitting: Child and Adolescent Psychiatry

## 2018-04-05 NOTE — Telephone Encounter (Signed)
Called to follow up on missed appointment today. She reported that her father made an appointment but she told him that she was going to be busy the entire day and therefore will not be able to make it. She reported that she is doing well. She was recommended to call front desk and schedule an appointment. She was also informed about the her no shows and that future no show will likely result in termination of care from this clinic. She verbalized understanding.

## 2018-04-09 ENCOUNTER — Encounter: Payer: Self-pay | Admitting: Child and Adolescent Psychiatry

## 2018-04-09 ENCOUNTER — Ambulatory Visit: Payer: Self-pay | Admitting: Child and Adolescent Psychiatry

## 2018-04-09 ENCOUNTER — Other Ambulatory Visit: Payer: Self-pay

## 2018-04-09 VITALS — BP 98/67 | HR 75 | Temp 97.8°F | Wt 155.2 lb

## 2018-04-09 DIAGNOSIS — F418 Other specified anxiety disorders: Secondary | ICD-10-CM | POA: Diagnosis not present

## 2018-04-09 DIAGNOSIS — F329 Major depressive disorder, single episode, unspecified: Secondary | ICD-10-CM | POA: Diagnosis not present

## 2018-04-09 DIAGNOSIS — F32A Depression, unspecified: Secondary | ICD-10-CM

## 2018-04-09 MED ORDER — FLUOXETINE HCL 10 MG PO CAPS
10.0000 mg | ORAL_CAPSULE | Freq: Every day | ORAL | 0 refills | Status: AC
Start: 2018-04-09 — End: ?

## 2018-04-09 MED ORDER — HYDROXYZINE HCL 25 MG PO TABS: 25.0000 mg | ORAL_TABLET | Freq: Three times a day (TID) | ORAL | 0 refills | Status: DC | PRN
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
## 2018-04-11 ENCOUNTER — Telehealth (HOSPITAL_COMMUNITY): Payer: Self-pay | Admitting: Psychiatry

## 2018-04-11 NOTE — Telephone Encounter (Signed)
D:  Placed call to pt per request of her psychiatrist (Dr. Jerold Coombe).  Discussed MH-IOP with patient and answered her questions.  Pt declined a start date at this time.  States she would like to discuss with her parents first.  A:  Inform Dr. Jerold Coombe and Everlene Balls, NP. Encouraged pt to call writer if she has any further questions.   R:  Pt receptive.

## 2018-04-13 ENCOUNTER — Telehealth (HOSPITAL_COMMUNITY): Payer: Self-pay | Admitting: Professional

## 2018-04-17 ENCOUNTER — Ambulatory Visit (HOSPITAL_COMMUNITY): Payer: Self-pay

## 2018-04-23 ENCOUNTER — Ambulatory Visit: Payer: 59 | Admitting: Child and Adolescent Psychiatry

## 2018-04-24 ENCOUNTER — Ambulatory Visit: Payer: 59 | Admitting: Licensed Clinical Social Worker

## 2023-03-09 ENCOUNTER — Ambulatory Visit (INDEPENDENT_AMBULATORY_CARE_PROVIDER_SITE_OTHER): Payer: BC Managed Care – PPO | Admitting: Nurse Practitioner

## 2023-03-09 ENCOUNTER — Encounter: Payer: Self-pay | Admitting: Nurse Practitioner

## 2023-03-09 VITALS — BP 100/64 | HR 85 | Temp 97.6°F | Resp 16 | Ht 66.0 in | Wt 157.3 lb

## 2023-03-09 DIAGNOSIS — M41124 Adolescent idiopathic scoliosis, thoracic region: Secondary | ICD-10-CM | POA: Insufficient documentation

## 2023-03-09 DIAGNOSIS — Z114 Encounter for screening for human immunodeficiency virus [HIV]: Secondary | ICD-10-CM

## 2023-03-09 DIAGNOSIS — Z7689 Persons encountering health services in other specified circumstances: Secondary | ICD-10-CM | POA: Diagnosis not present

## 2023-03-09 DIAGNOSIS — Z87898 Personal history of other specified conditions: Secondary | ICD-10-CM | POA: Diagnosis not present

## 2023-03-09 DIAGNOSIS — Z13220 Encounter for screening for lipoid disorders: Secondary | ICD-10-CM

## 2023-03-09 DIAGNOSIS — Z131 Encounter for screening for diabetes mellitus: Secondary | ICD-10-CM

## 2023-03-09 DIAGNOSIS — Z130 Encounter for screening for diseases of the blood and blood-forming organs and certain disorders involving the immune mechanism: Secondary | ICD-10-CM

## 2023-03-09 DIAGNOSIS — Z1159 Encounter for screening for other viral diseases: Secondary | ICD-10-CM

## 2023-03-09 NOTE — Assessment & Plan Note (Signed)
Improved, still happens with intensive exercise like running

## 2023-03-09 NOTE — Assessment & Plan Note (Signed)
Sees chiropractor

## 2023-03-09 NOTE — Progress Notes (Signed)
BP 100/64   Pulse 85   Temp 97.6 F (36.4 C) (Oral)   Resp 16   Ht 5\' 6"  (1.676 m)   Wt 157 lb 4.8 oz (71.4 kg)   LMP 03/03/2023 (Exact Date)   SpO2 97%   BMI 25.39 kg/m    Subjective:    Patient ID: Jocelyn Williams, female    DOB: August 02, 1995, 27 y.o.   MRN: [id number]  HPI: Jocelyn Williams is a 27 y.o. female  Chief Complaint  Patient presents with   Establish Care   Establish care: her last physical was years ago.  Medical history includes none.  Family history includes skin cancer, breast cancer.  Health maintenance due for labs, pap.   History of chest pain: patient reports that back in 2016 while she was playing soccer she had intense chest pain. She says that she was worked up by cardiology, pulmonology. She had ekgs, stress tests, xrays, she reports she even had injections in her chest to help with the pain.  She says she only gets the pain when she is running for a period of time. She also reports that it is not as bad as it used to be.    Scoliosis: patient reports she does have scoliosis.  She says that she knows she needs to see the chiropractor and have acupuncture because that helps her.  MRI:  There is mild exaggeration of the lower thoracic kyphosis. Multilevel Schmorl's nodes are present, from T5-6 continuously through the partially imaged L1-2 level. Mild multilevel degenerative disc change throughout the lower thoracic spine greatest at T10-11 with disc height loss and reactive endplate signal change. Marrow signal is otherwise normal.       03/09/2023    8:31 AM 12/27/2017   11:10 AM  Depression screen PHQ 2/9  Decreased Interest 0 0  Down, Depressed, Hopeless 0 0  PHQ - 2 Score 0 0  Altered sleeping 0   Tired, decreased energy 0   Change in appetite 0   Feeling bad or failure about yourself  0   Trouble concentrating 0   Moving slowly or fidgety/restless 0   Suicidal thoughts 0   PHQ-9 Score 0   Difficult doing work/chores Not difficult at all         03/09/2023    8:31 AM  GAD 7 : Generalized Anxiety Score  Nervous, Anxious, on Edge 0  Control/stop worrying 0  Worry too much - different things 0  Trouble relaxing 0  Restless 0  Easily annoyed or irritable 0  Afraid - awful might happen 0  Total GAD 7 Score 0  Anxiety Difficulty Not difficult at all     Relevant past medical, surgical, family and social history reviewed and updated as indicated. Interim medical history since our last visit reviewed. Allergies and medications reviewed and updated.  Review of Systems  Constitutional: Negative for fever or weight change.  Respiratory: Negative for cough and shortness of breath.   Cardiovascular: Negative for chest pain or palpitations.  Gastrointestinal: Negative for abdominal pain, no bowel changes.  Musculoskeletal: Negative for gait problem or joint swelling.  Skin: Negative for rash.  Neurological: Negative for dizziness or headache.  No other specific complaints in a complete review of systems (except as listed in HPI above).      Objective:    BP 100/64   Pulse 85   Temp 97.6 F (36.4 C) (Oral)   Resp 16   Ht 5\' 6"  (1.676 m)  Wt 157 lb 4.8 oz (71.4 kg)   LMP 03/03/2023 (Exact Date)   SpO2 97%   BMI 25.39 kg/m   Wt Readings from Last 3 Encounters:  03/09/23 157 lb 4.8 oz (71.4 kg)  12/27/17 120 lb 4.8 oz (54.6 kg) (35%, Z= -0.38)*  12/19/17 135 lb (61.2 kg) (63%, Z= 0.32)*   * Growth percentiles are based on CDC (Girls, 2-20 Years) data.    Physical Exam  Constitutional: Patient appears well-developed and well-nourished.  No distress.  HEENT: head atraumatic, normocephalic, pupils equal and reactive to light, neck supple Cardiovascular: Normal rate, regular rhythm and normal heart sounds.  No murmur heard. No BLE edema. Pulmonary/Chest: Effort normal and breath sounds normal. No respiratory distress. Abdominal: Soft.  There is no tenderness. Psychiatric: Patient has a normal mood and affect. behavior is  normal. Judgment and thought content normal.  Results for orders placed or performed during the hospital encounter of 02/20/18  Comprehensive metabolic panel  Result Value Ref Range   Sodium 141 135 - 145 mmol/L   Potassium 4.3 3.5 - 5.1 mmol/L   Chloride 105 98 - 111 mmol/L   CO2 26 22 - 32 mmol/L   Glucose, Bld 86 70 - 99 mg/dL   BUN 10 6 - 20 mg/dL   Creatinine, Ser 1.47 0.44 - 1.00 mg/dL   Calcium 82.9 (H) 8.9 - 10.3 mg/dL   Total Protein 7.7 6.5 - 8.1 g/dL   Albumin 4.5 3.5 - 5.0 g/dL   AST 25 15 - 41 U/L   ALT 34 0 - 44 U/L   Alkaline Phosphatase 50 38 - 126 U/L   Total Bilirubin 1.0 0.3 - 1.2 mg/dL   GFR calc non Af Amer >60 >60 mL/min   GFR calc Af Amer >60 >60 mL/min   Anion gap 10 5 - 15  Ethanol  Result Value Ref Range   Alcohol, Ethyl (B) <10 <10 mg/dL  Salicylate level  Result Value Ref Range   Salicylate Lvl <7.0 2.8 - 30.0 mg/dL  Acetaminophen level  Result Value Ref Range   Acetaminophen (Tylenol), Serum <10 (L) 10 - 30 ug/mL  cbc  Result Value Ref Range   WBC 5.1 4.0 - 10.5 K/uL   RBC 4.47 3.87 - 5.11 MIL/uL   Hemoglobin 14.2 12.0 - 15.0 g/dL   HCT 56.2 13.0 - 86.5 %   MCV 96.4 78.0 - 100.0 fL   MCH 31.8 26.0 - 34.0 pg   MCHC 32.9 30.0 - 36.0 g/dL   RDW 78.4 69.6 - 29.5 %   Platelets 285 150 - 400 K/uL  Rapid urine drug screen (hospital performed)  Result Value Ref Range   Opiates NONE DETECTED NONE DETECTED   Cocaine NONE DETECTED NONE DETECTED   Benzodiazepines NONE DETECTED NONE DETECTED   Amphetamines NONE DETECTED NONE DETECTED   Tetrahydrocannabinol POSITIVE (A) NONE DETECTED   Barbiturates NONE DETECTED NONE DETECTED  I-Stat beta hCG blood, ED  Result Value Ref Range   I-stat hCG, quantitative <5.0 <5 mIU/mL   Comment 3              Assessment & Plan:   Problem List Items Addressed This Visit       Musculoskeletal and Integument   Adolescent idiopathic scoliosis of thoracic region - Primary    Sees chiropractor         Other    History of chest pain    Improved, still happens with intensive exercise like running  Other Visit Diagnoses     Screening for HIV without presence of risk factors       Relevant Orders   HIV Antibody (routine testing w rflx)   Encounter to establish care       schedule cpe when due   Screening for diabetes mellitus       Relevant Orders   COMPLETE METABOLIC PANEL WITH GFR   Hemoglobin A1c   Encounter for hepatitis C screening test for low risk patient       Relevant Orders   Hepatitis C antibody   Screening for deficiency anemia       Relevant Orders   CBC with Differential/Platelet   Screening for cholesterol level       Relevant Orders   Lipid panel        Follow up plan: Return in about 1 year (around 03/08/2024) for cpe.

## 2023-03-10 LAB — CBC WITH DIFFERENTIAL/PLATELET
Absolute Monocytes: 459 {cells}/uL (ref 200–950)
Basophils Absolute: 59 {cells}/uL (ref 0–200)
Basophils Relative: 1.1 %
Eosinophils Absolute: 97 {cells}/uL (ref 15–500)
Eosinophils Relative: 1.8 %
HCT: 43.6 % (ref 35.0–45.0)
Hemoglobin: 14.6 g/dL (ref 11.7–15.5)
Lymphs Abs: 1971 {cells}/uL (ref 850–3900)
MCH: 32.9 pg (ref 27.0–33.0)
MCHC: 33.5 g/dL (ref 32.0–36.0)
MCV: 98.2 fL (ref 80.0–100.0)
MPV: 11.4 fL (ref 7.5–12.5)
Monocytes Relative: 8.5 %
Neutro Abs: 2813 {cells}/uL (ref 1500–7800)
Neutrophils Relative %: 52.1 %
Platelets: 225 10*3/uL (ref 140–400)
RBC: 4.44 10*6/uL (ref 3.80–5.10)
RDW: 12.7 % (ref 11.0–15.0)
Total Lymphocyte: 36.5 %
WBC: 5.4 10*3/uL (ref 3.8–10.8)

## 2023-03-10 LAB — COMPLETE METABOLIC PANEL WITH GFR
AG Ratio: 1.7 (calc) (ref 1.0–2.5)
ALT: 10 U/L (ref 6–29)
AST: 14 U/L (ref 10–30)
Albumin: 4.6 g/dL (ref 3.6–5.1)
Alkaline phosphatase (APISO): 47 U/L (ref 31–125)
BUN: 15 mg/dL (ref 7–25)
CO2: 26 mmol/L (ref 20–32)
Calcium: 9.5 mg/dL (ref 8.6–10.2)
Chloride: 103 mmol/L (ref 98–110)
Creat: 0.86 mg/dL (ref 0.50–0.96)
Globulin: 2.7 g/dL (ref 1.9–3.7)
Glucose, Bld: 71 mg/dL (ref 65–99)
Potassium: 4.3 mmol/L (ref 3.5–5.3)
Sodium: 137 mmol/L (ref 135–146)
Total Bilirubin: 0.6 mg/dL (ref 0.2–1.2)
Total Protein: 7.3 g/dL (ref 6.1–8.1)
eGFR: 97 mL/min/{1.73_m2} (ref >=60)

## 2023-03-10 LAB — LIPID PANEL
Cholesterol: 207 mg/dL — ABNORMAL HIGH (ref <200)
HDL: 105 mg/dL (ref >=50)
LDL Cholesterol (Calc): 88 mg/dL
Non-HDL Cholesterol (Calc): 102 mg/dL (ref <130)
Total CHOL/HDL Ratio: 2 (calc) (ref <5.0)
Triglycerides: 54 mg/dL (ref <150)

## 2023-03-10 LAB — HIV ANTIBODY (ROUTINE TESTING W REFLEX): HIV 1&2 Ab, 4th Generation: NONREACTIVE

## 2023-03-10 LAB — HEMOGLOBIN A1C
Hgb A1c MFr Bld: 4.9 %{Hb} (ref <5.7)
Mean Plasma Glucose: 94 mg/dL
eAG (mmol/L): 5.2 mmol/L

## 2023-03-10 LAB — HEPATITIS C ANTIBODY: Hepatitis C Ab: NONREACTIVE

## 2023-04-28 ENCOUNTER — Other Ambulatory Visit: Payer: Self-pay

## 2023-04-28 ENCOUNTER — Emergency Department
Admission: EM | Admit: 2023-04-28 | Discharge: 2023-04-28 | Disposition: A | Discharge status: Home / Self Care | Payer: Medicaid Other | Attending: Student in an Organized Health Care Education/Training Program | Admitting: Student in an Organized Health Care Education/Training Program

## 2023-04-28 DIAGNOSIS — R112 Nausea with vomiting, unspecified: Secondary | ICD-10-CM | POA: Insufficient documentation

## 2023-04-28 DIAGNOSIS — R197 Diarrhea, unspecified: Secondary | ICD-10-CM | POA: Diagnosis not present

## 2023-04-28 DIAGNOSIS — R9431 Abnormal electrocardiogram [ECG] [EKG]: Secondary | ICD-10-CM | POA: Diagnosis not present

## 2023-04-28 DIAGNOSIS — R5381 Other malaise: Secondary | ICD-10-CM | POA: Diagnosis not present

## 2023-04-28 LAB — COMPREHENSIVE METABOLIC PANEL
ALT: 26 U/L (ref 0–44)
AST: 27 U/L (ref 15–41)
Albumin: 4.6 g/dL (ref 3.5–5.0)
Alkaline Phosphatase: 38 U/L (ref 38–126)
Anion gap: 14 (ref 5–15)
BUN: 14 mg/dL (ref 6–20)
CO2: 19 mmol/L — ABNORMAL LOW (ref 22–32)
Calcium: 9.3 mg/dL (ref 8.9–10.3)
Chloride: 104 mmol/L (ref 98–111)
Creatinine, Ser: 0.89 mg/dL (ref 0.44–1.00)
GFR, Estimated: >60 mL/min (ref >60)
Glucose, Bld: 189 mg/dL — ABNORMAL HIGH (ref 70–99)
Potassium: 3.7 mmol/L (ref 3.5–5.1)
Sodium: 137 mmol/L (ref 135–145)
Total Bilirubin: 1.3 mg/dL — ABNORMAL HIGH (ref <1.2)
Total Protein: 7.4 g/dL (ref 6.5–8.1)

## 2023-04-28 LAB — URINALYSIS, ROUTINE W REFLEX MICROSCOPIC
Bilirubin Urine: NEGATIVE
Glucose, UA: 50 mg/dL — AB
Hgb urine dipstick: NEGATIVE
Ketones, ur: 80 mg/dL — AB
Leukocytes,Ua: NEGATIVE
Nitrite: NEGATIVE
Protein, ur: 100 mg/dL — AB
Specific Gravity, Urine: 1.03 (ref 1.005–1.030)
pH: 9 — ABNORMAL HIGH (ref 5.0–8.0)

## 2023-04-28 LAB — CBC
HCT: 43.2 % (ref 36.0–46.0)
Hemoglobin: 15.6 g/dL — ABNORMAL HIGH (ref 12.0–15.0)
MCH: 32.6 pg (ref 26.0–34.0)
MCHC: 36.1 g/dL — ABNORMAL HIGH (ref 30.0–36.0)
MCV: 90.4 fL (ref 80.0–100.0)
Platelets: 261 10*3/uL (ref 150–400)
RBC: 4.78 MIL/uL (ref 3.87–5.11)
RDW: 12.1 % (ref 11.5–15.5)
WBC: 12.4 10*3/uL — ABNORMAL HIGH (ref 4.0–10.5)
nRBC: 0 % (ref 0.0–0.2)

## 2023-04-28 LAB — LIPASE, BLOOD: Lipase: 28 U/L (ref 11–51)

## 2023-04-28 LAB — POC URINE PREG, ED: Preg Test, Ur: NEGATIVE

## 2023-04-28 MED ORDER — ONDANSETRON 4 MG PO TBDP: 4.0000 mg | ORAL_TABLET | Freq: Three times a day (TID) | ORAL | 0 refills | Status: AC | PRN

## 2023-04-28 MED ORDER — ONDANSETRON HCL 4 MG/2ML IJ SOLN
4.0000 mg | Freq: Once | INTRAMUSCULAR | Status: AC
  Administered 2023-04-28: 4 mg via INTRAVENOUS
  Filled 2023-04-28: qty 2

## 2023-04-28 MED ORDER — PROMETHAZINE HCL 12.5 MG PO TABS: 12.5000 mg | ORAL_TABLET | Freq: Four times a day (QID) | ORAL | 0 refills | Status: AC | PRN

## 2023-04-28 MED ORDER — SODIUM CHLORIDE 0.9 % IV BOLUS
1000.0000 mL | Freq: Once | INTRAVENOUS | Status: AC
  Administered 2023-04-28: 1000 mL via INTRAVENOUS

## 2023-04-28 MED ORDER — SODIUM CHLORIDE 0.9 % IV SOLN: 12.5000 mg | Freq: Four times a day (QID) | INTRAVENOUS | Status: DC | PRN

## 2023-04-28 MED ORDER — DEXTROSE 5 % IN LACTATED RINGERS IV BOLUS
1000.0000 mL | Freq: Once | INTRAVENOUS | Status: AC
  Administered 2023-04-28: 1000 mL via INTRAVENOUS
  Filled 2023-04-28: qty 1000

## 2023-04-28 NOTE — ED Triage Notes (Signed)
Pt to ED for v/d started last night. Reports cannot eat or drink. +abd pain, h/a, "everything hurts" Pt crying and moaning in triage

## 2023-04-28 NOTE — ED Provider Notes (Signed)
[facility] Provider Note    Event Date/Time   First MD Initiated Contact with Patient 04/28/23 1020     (approximate)   History   Emesis   HPI  Jocelyn Williams is a 27 y.o. female history of anxiety as well as recent diagnosis of pyelonephritis presents to the ER for evaluation of nausea vomiting generalized malaise aches diarrhea that started overnight.  Denies any focal abdominal pain particularly no right sided abdominal pain.  Denies any discharge no dysuria.  States that it may feel somewhat like when she had kidney infection but is not quite sure.     Physical Exam   Triage Vital Signs: ED Triage Vitals  Encounter Vitals Group     BP 04/28/23 1002 116/70     Systolic BP Percentile --      Diastolic BP Percentile --      Pulse Rate 04/28/23 1002 85     Resp 04/28/23 0959 18     Temp 04/28/23 0959 98.1 F (36.7 C)     Temp src --      SpO2 04/28/23 1002 97 %     Weight 04/28/23 1000 147 lb (66.7 kg)     Height 04/28/23 1000 5\' 7"  (1.702 m)     Head Circumference --      Peak Flow --      Pain Score 04/28/23 1000 7     Pain Loc --      Pain Education --      Exclude from Growth Chart --     Most recent vital signs: Vitals:   04/28/23 1230 04/28/23 1300  BP: 110/70 115/61  Pulse: 87 63  Resp: 19 (!) 21  Temp:    SpO2:       Constitutional: Alert  Eyes: Conjunctivae are normal.  Head: Atraumatic. Nose: No congestion/rhinnorhea. Mouth/Throat: Mucous membranes are moist.   Neck: Painless ROM.  Cardiovascular:   Good peripheral circulation. Respiratory: Normal respiratory effort.  No retractions.  Gastrointestinal: Soft and nontender.  Musculoskeletal:  no deformity Neurologic:  MAE spontaneously. No gross focal neurologic deficits are appreciated.  Skin:  Skin is warm, dry and intact. No rash noted. Psychiatric: Mood and affect are normal. Speech and behavior are normal.    ED Results / Procedures / Treatments    Labs (all labs ordered are listed, but only abnormal results are displayed) Labs Reviewed  COMPREHENSIVE METABOLIC PANEL - Abnormal; Notable for the following components:      Result Value   CO2 19 (*)    Glucose, Bld 189 (*)    Total Bilirubin 1.3 (*)    All other components within normal limits  CBC - Abnormal; Notable for the following components:   WBC 12.4 (*)    Hemoglobin 15.6 (*)    MCHC 36.1 (*)    All other components within normal limits  URINALYSIS, ROUTINE W REFLEX MICROSCOPIC - Abnormal; Notable for the following components:   Color, Urine YELLOW (*)    APPearance HAZY (*)    pH 9.0 (*)    Glucose, UA 50 (*)    Ketones, ur 80 (*)    Protein, ur 100 (*)    Bacteria, UA RARE (*)    All other components within normal limits  LIPASE, BLOOD  POC URINE PREG, ED     EKG  ED ECG REPORT I, Willy Eddy, the attending physician, personally viewed and interpreted this ECG.   Date: 04/28/2023  EKG Time: 10:09  Rate: 70  Rhythm: sinus  Axis: normal  Intervals:normal  ST&T Change: no stemi, no depressions       PROCEDURES:  Critical Care performed:   Procedures   MEDICATIONS ORDERED IN ED: Medications  promethazine (PHENERGAN) 12.5 mg in sodium chloride 0.9 % 50 mL IVPB (has no administration in time range)  sodium chloride 0.9 % bolus 1,000 mL (0 mLs Intravenous Stopped 04/28/23 1122)  ondansetron (ZOFRAN) injection 4 mg (4 mg Intravenous Given 04/28/23 1126)  dextrose 5% lactated ringers bolus 1,000 mL (0 mLs Intravenous Stopped 04/28/23 1257)     IMPRESSION / MDM / ASSESSMENT AND PLAN / ED COURSE  I reviewed the triage vital signs and the nursing notes.                              Differential diagnosis includes, but is not limited to, enteritis, gastritis, colitis, dehydration, UTI, pyelo-, appendicitis, pregnancy  Patient presenting to the ER for evaluation of symptoms as described above.  Based on symptoms, risk factors and considered  above differential, this presenting complaint could reflect a potentially life-threatening illness therefore the patient will be placed on continuous pulse oximetry and telemetry for monitoring.  Laboratory evaluation will be sent to evaluate for the above complaints.      Clinical Course as of 04/28/23 1405  Fri Apr 28, 2023  1225 Patient still states that she is feeling nauseated.  Will continue IV hydration.  Repeat abdominal exam soft benign.  Urinalysis without evidence of infection.  Will give Phenergan. [PR]  1403 Patient feeling improved.  Repeat abdominal exam is soft and benign.  Discussed option for further symptomatic treatment observation here in the ER but patient states that she is feeling better does still have some nausea and is requesting discharge home.  She was encouraged to return to the ER in 24 hours if symptoms not improved and sooner if they worsen.  Given her otherwise reassuring workup I do believe that outpatient follow-up is reasonable. [PR]    Clinical Course User Index [PR] Willy Eddy, MD     FINAL CLINICAL IMPRESSION(S) / ED DIAGNOSES   Final diagnoses:  Nausea vomiting and diarrhea     Rx / DC Orders   ED Discharge Orders          Ordered    ondansetron (ZOFRAN-ODT) 4 MG disintegrating tablet  Every 8 hours PRN        04/28/23 1405    promethazine (PHENERGAN) 12.5 MG tablet  Every 6 hours PRN        04/28/23 1405             Note:  This document was prepared using Dragon voice recognition software and may include unintentional dictation errors.    Willy Eddy, MD 04/28/23 [phone]

## 2023-08-03 ENCOUNTER — Other Ambulatory Visit: Payer: Self-pay

## 2023-08-03 ENCOUNTER — Emergency Department: Payer: Medicaid Other

## 2023-08-03 ENCOUNTER — Encounter: Payer: Self-pay | Admitting: Emergency Medicine

## 2023-08-03 ENCOUNTER — Emergency Department
Admission: EM | Admit: 2023-08-03 | Discharge: 2023-08-03 | Disposition: A | Discharge status: Home / Self Care | Payer: Medicaid Other | Attending: Emergency Medicine | Admitting: Emergency Medicine

## 2023-08-03 DIAGNOSIS — S0990XA Unspecified injury of head, initial encounter: Secondary | ICD-10-CM | POA: Diagnosis not present

## 2023-08-03 DIAGNOSIS — Z041 Encounter for examination and observation following transport accident: Secondary | ICD-10-CM | POA: Diagnosis not present

## 2023-08-03 DIAGNOSIS — M79603 Pain in arm, unspecified: Secondary | ICD-10-CM | POA: Diagnosis not present

## 2023-08-03 DIAGNOSIS — M546 Pain in thoracic spine: Secondary | ICD-10-CM | POA: Diagnosis not present

## 2023-08-03 DIAGNOSIS — S199XXA Unspecified injury of neck, initial encounter: Secondary | ICD-10-CM | POA: Diagnosis not present

## 2023-08-03 DIAGNOSIS — M542 Cervicalgia: Secondary | ICD-10-CM | POA: Diagnosis not present

## 2023-08-03 DIAGNOSIS — Y92410 Unspecified street and highway as the place of occurrence of the external cause: Secondary | ICD-10-CM | POA: Insufficient documentation

## 2023-08-03 DIAGNOSIS — M79631 Pain in right forearm: Secondary | ICD-10-CM | POA: Diagnosis not present

## 2023-08-03 DIAGNOSIS — M549 Dorsalgia, unspecified: Secondary | ICD-10-CM | POA: Diagnosis not present

## 2023-08-03 DIAGNOSIS — M79601 Pain in right arm: Secondary | ICD-10-CM | POA: Diagnosis not present

## 2023-08-03 NOTE — Discharge Instructions (Signed)
 Please take Tylenol 650 mg 4 times daily and ibuprofen up to 600 mg 3 times daily for the next few days.  Follow-up with primary care for further evaluation.  Return here for new or worse symptoms.
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
## 2023-08-28 ENCOUNTER — Encounter
